# Patient Record
Sex: Female | Born: 1975 | Race: White | Hispanic: No | Marital: Married | Smoking: Never smoker
Health system: Southern US, Community
[De-identification: ages and names within clinical notes are randomized; demographics above are authoritative.]

---

## 1999-06-16 HISTORY — PX: BREAST LUMPECTOMY: SHX2

## 2021-01-29 ENCOUNTER — Ambulatory Visit (INDEPENDENT_AMBULATORY_CARE_PROVIDER_SITE_OTHER): Payer: 59 | Admitting: Family Medicine

## 2021-01-29 ENCOUNTER — Other Ambulatory Visit: Payer: Self-pay

## 2021-01-29 ENCOUNTER — Encounter: Payer: Self-pay | Admitting: Family Medicine

## 2021-01-29 VITALS — BP 148/91 | HR 74 | Temp 97.5°F | Resp 16 | Ht 69.0 in | Wt 175.0 lb

## 2021-01-29 DIAGNOSIS — Z7689 Persons encountering health services in other specified circumstances: Secondary | ICD-10-CM | POA: Diagnosis not present

## 2021-01-29 DIAGNOSIS — M2550 Pain in unspecified joint: Secondary | ICD-10-CM | POA: Diagnosis not present

## 2021-01-29 NOTE — Progress Notes (Signed)
   New Patient Office Visit  Subjective:  Patient ID: Regina Haley, female    DOB: 04/17/1976  Age: 45 y.o. MRN: [id number]  CC:  Chief Complaint  Patient presents with   joint pain    HPI Regina Haley presents for to establish care. Patient complains today of joint pain. Sx have been for about a year but now worsening. Was intermittent but now constant. Takes nsaids for sx with some relief. Sx are worse at night.   No past medical history on file.  Family History  Problem Relation Age of Onset   Arthritis Mother    Diabetes Sister    Cancer Maternal Grandmother     Social History   Socioeconomic History   Marital status: Married    Spouse name: Not on file   Number of children: 3   Years of education: Not on file   Highest education level: Not on file  Occupational History   Not on file  Tobacco Use   Smoking status: Never   Smokeless tobacco: Never  Vaping Use   Vaping Use: Never used  Substance and Sexual Activity   Alcohol use: Not Currently   Drug use: Not Currently   Sexual activity: Yes  Other Topics Concern   Not on file  Social History Narrative   Not on file   Social Determinants of Health   Financial Resource Strain: Not on file  Food Insecurity: Not on file  Transportation Needs: Not on file  Physical Activity: Not on file  Stress: Not on file  Social Connections: Not on file  Intimate Partner Violence: Not on file    ROS Review of Systems  Musculoskeletal:  Positive for arthralgias.  All other systems reviewed and are negative.  Objective:   Today's Vitals: BP (!) 148/91 (BP Location: Right Arm, Patient Position: Sitting, Cuff Size: Normal)   Pulse 74   Temp (!) 97.5 F (36.4 C)   Resp 16   Ht 5\' 9"  (1.753 m)   Wt 175 lb (79.4 kg)   LMP  (LMP Unknown)   SpO2 96%   BMI 25.84 kg/m   Physical Exam Vitals and nursing note reviewed.  Constitutional:      General: She is not in acute distress. Cardiovascular:     Rate and  Rhythm: Normal rate and regular rhythm.  Pulmonary:     Effort: Pulmonary effort is normal.     Breath sounds: Normal breath sounds.  Abdominal:     Palpations: Abdomen is soft.     Tenderness: There is no abdominal tenderness.  Musculoskeletal:        General: Tenderness (joints) present. No swelling or deformity.  Neurological:     General: No focal deficit present.     Mental Status: She is alert and oriented to person, place, and time.    Assessment & Plan:  1. Arthralgia, unspecified joint Referral for labs.  Patient to utilize OTC Tylenol/NSAIDs as needed for symptoms.  Consider referral to consultant pending results of lab work. - Sedimentation Rate - ANA - Rheumatoid factor - CBC with Differential - Basic Metabolic Panel  2. Encounter to establish care   Outpatient Encounter Medications as of 01/29/2021  Medication Sig   sertraline (ZOLOFT) 50 MG tablet Take 50 mg by mouth daily.   No facility-administered encounter medications on file as of 01/29/2021.    Follow-up: Return in about 6 weeks (around 03/12/2021) for follow up.   03/14/2021, MD

## 2021-01-29 NOTE — Progress Notes (Signed)
Establish care-  Joint pain 4/10 constant, worse at night  Take ibuprofen  ___________________   Irregular menses

## 2021-01-30 ENCOUNTER — Encounter: Payer: Self-pay | Admitting: Family Medicine

## 2021-01-30 LAB — BASIC METABOLIC PANEL
BUN/Creatinine Ratio: 11 (ref 9–23)
BUN: 9 mg/dL (ref 6–24)
CO2: 23 mmol/L (ref 20–29)
Calcium: 9.2 mg/dL (ref 8.7–10.2)
Chloride: 105 mmol/L (ref 96–106)
Creatinine, Ser: 0.8 mg/dL (ref 0.57–1.00)
Glucose: 76 mg/dL (ref 65–99)
Potassium: 4.2 mmol/L (ref 3.5–5.2)
Sodium: 143 mmol/L (ref 134–144)
eGFR: 93 mL/min/{1.73_m2} (ref >59)

## 2021-01-30 LAB — CBC WITH DIFFERENTIAL/PLATELET
Basophils Absolute: 0 10*3/uL (ref 0.0–0.2)
Basos: 0 %
EOS (ABSOLUTE): 0.1 10*3/uL (ref 0.0–0.4)
Eos: 1 %
Hematocrit: 38.8 % (ref 34.0–46.6)
Hemoglobin: 13.2 g/dL (ref 11.1–15.9)
Immature Grans (Abs): 0 10*3/uL (ref 0.0–0.1)
Immature Granulocytes: 0 %
Lymphocytes Absolute: 2 10*3/uL (ref 0.7–3.1)
Lymphs: 27 %
MCH: 31.3 pg (ref 26.6–33.0)
MCHC: 34 g/dL (ref 31.5–35.7)
MCV: 92 fL (ref 79–97)
Monocytes Absolute: 0.5 10*3/uL (ref 0.1–0.9)
Monocytes: 7 %
Neutrophils Absolute: 4.8 10*3/uL (ref 1.4–7.0)
Neutrophils: 65 %
Platelets: 326 10*3/uL (ref 150–450)
RBC: 4.22 x10E6/uL (ref 3.77–5.28)
RDW: 12.8 % (ref 11.7–15.4)
WBC: 7.4 10*3/uL (ref 3.4–10.8)

## 2021-01-30 LAB — RHEUMATOID FACTOR: Rheumatoid fact SerPl-aCnc: <10 IU/mL (ref <14.0)

## 2021-01-30 LAB — ANA: Anti Nuclear Antibody (ANA): NEGATIVE

## 2021-01-30 LAB — SEDIMENTATION RATE: Sed Rate: 2 mm/hr (ref 0–32)

## 2021-07-18 ENCOUNTER — Other Ambulatory Visit: Payer: Self-pay | Admitting: Family Medicine

## 2021-07-24 ENCOUNTER — Ambulatory Visit: Payer: 59 | Admitting: Family Medicine

## 2021-07-24 ENCOUNTER — Other Ambulatory Visit: Payer: Self-pay

## 2021-07-24 ENCOUNTER — Encounter: Payer: Self-pay | Admitting: Family Medicine

## 2021-07-24 VITALS — BP 126/86 | HR 66 | Temp 98.1°F | Resp 16 | Wt 182.4 lb

## 2021-07-24 DIAGNOSIS — N951 Menopausal and female climacteric states: Secondary | ICD-10-CM

## 2021-07-24 DIAGNOSIS — M549 Dorsalgia, unspecified: Secondary | ICD-10-CM

## 2021-07-24 DIAGNOSIS — N3000 Acute cystitis without hematuria: Secondary | ICD-10-CM

## 2021-07-24 DIAGNOSIS — F32A Depression, unspecified: Secondary | ICD-10-CM

## 2021-07-24 LAB — POCT URINALYSIS DIP (CLINITEK)
Bilirubin, UA: NEGATIVE
Blood, UA: NEGATIVE
Glucose, UA: NEGATIVE mg/dL
Ketones, POC UA: NEGATIVE mg/dL
Leukocytes, UA: NEGATIVE
Nitrite, UA: NEGATIVE
POC PROTEIN,UA: NEGATIVE
Spec Grav, UA: 1.01 (ref 1.010–1.025)
Urobilinogen, UA: 0.2 E.U./dL
pH, UA: 7.5 (ref 5.0–8.0)

## 2021-07-24 MED ORDER — SERTRALINE HCL 100 MG PO TABS: 100.0000 mg | ORAL_TABLET | Freq: Every day | ORAL | 1 refills | Status: DC

## 2021-07-24 MED ORDER — NITROFURANTOIN MONOHYD MACRO 100 MG PO CAPS: 100.0000 mg | ORAL_CAPSULE | Freq: Two times a day (BID) | ORAL | 0 refills | Status: AC

## 2021-07-24 NOTE — Progress Notes (Signed)
O oPatient is here for medication refill of her sertraline.   Patient also c/o back pain with a possible UTI . This has been present for 1 week.

## 2021-07-25 NOTE — Progress Notes (Signed)
Established  Patient Office Visit  Subjective:  Patient ID: Regina Haley, female    DOB: Jan 30, 1976  Age: 46 y.o. MRN: [id number]  CC:  Chief Complaint  Patient presents with   Urinary Tract Infection    HPI Regina Haley presents for complaint of back pain with strong smelling urine. Patient denies fever./chills or dysuria. Patient also request refills of zoloft and would like to try increasing dose. She also reports hot flashes.   No past medical history on file.  Past Surgical History:  Procedure Laterality Date   BREAST LUMPECTOMY Right 2001    Family History  Problem Relation Age of Onset   Arthritis Mother    Diabetes Sister    Cancer Maternal Grandmother     Social History   Socioeconomic History   Marital status: Married    Spouse name: Not on file   Number of children: 3   Years of education: Not on file   Highest education level: Not on file  Occupational History   Not on file  Tobacco Use   Smoking status: Never   Smokeless tobacco: Never  Vaping Use   Vaping Use: Never used  Substance and Sexual Activity   Alcohol use: Not Currently   Drug use: Not Currently   Sexual activity: Yes  Other Topics Concern   Not on file  Social History Narrative   Not on file   Social Determinants of Health   Financial Resource Strain: Not on file  Food Insecurity: Not on file  Transportation Needs: Not on file  Physical Activity: Not on file  Stress: Not on file  Social Connections: Not on file  Intimate Partner Violence: Not on file    ROS Review of Systems  Genitourinary:  Negative for dysuria.  Musculoskeletal:  Positive for back pain.  Psychiatric/Behavioral:  Positive for sleep disturbance. Negative for self-injury and suicidal ideas. The patient is not nervous/anxious.   All other systems reviewed and are negative.  Objective:   Today's Vitals: BP 126/86    Pulse 66    Temp 98.1 F (36.7 C) (Oral)    Resp 16    Wt 182 lb 6.4 oz (82.7 kg)     SpO2 98%    BMI 26.94 kg/m   Physical Exam Vitals and nursing note reviewed.  Constitutional:      General: She is not in acute distress. Cardiovascular:     Rate and Rhythm: Normal rate and regular rhythm.  Pulmonary:     Effort: Pulmonary effort is normal.     Breath sounds: Normal breath sounds.  Abdominal:     Palpations: Abdomen is soft.     Tenderness: There is no abdominal tenderness.  Neurological:     General: No focal deficit present.     Mental Status: She is alert and oriented to person, place, and time.  Psychiatric:        Mood and Affect: Mood normal.        Behavior: Behavior normal.    Assessment & Plan:   1. Depression, unspecified depression type Zoloft increased form 50 mg daily to 100mg  daily. monitor  2. Acute cystitis without hematuria Macrobid prescribed - POCT URINALYSIS DIP (CLINITEK)  3. Perimenopausal Patient will try OTC agent for sx.     Outpatient Encounter Medications as of 07/24/2021  Medication Sig   nitrofurantoin, macrocrystal-monohydrate, (MACROBID) 100 MG capsule Take 1 capsule (100 mg total) by mouth 2 (two) times daily.   sertraline (ZOLOFT) 100 MG tablet  Take 1 tablet (100 mg total) by mouth at bedtime.   [DISCONTINUED] sertraline (ZOLOFT) 100 MG tablet    [DISCONTINUED] sertraline (ZOLOFT) 50 MG tablet Take 50 mg by mouth daily.   No facility-administered encounter medications on file as of 07/24/2021.    Follow-up: No follow-ups on file.   Becky Sax, MD

## 2021-09-16 ENCOUNTER — Ambulatory Visit (INDEPENDENT_AMBULATORY_CARE_PROVIDER_SITE_OTHER): Payer: 59 | Admitting: Family Medicine

## 2021-09-16 VITALS — BP 116/81 | HR 63 | Temp 98.0°F | Resp 16 | Wt 183.6 lb

## 2021-09-16 DIAGNOSIS — F32A Depression, unspecified: Secondary | ICD-10-CM

## 2021-09-16 DIAGNOSIS — Z1231 Encounter for screening mammogram for malignant neoplasm of breast: Secondary | ICD-10-CM

## 2021-09-16 MED ORDER — SERTRALINE HCL 100 MG PO TABS: 100.0000 mg | ORAL_TABLET | Freq: Every day | ORAL | 1 refills | Status: DC

## 2021-09-16 NOTE — Progress Notes (Signed)
? ?Established Patient Office Visit ? ?Subjective:  ?Patient ID: Regina Haley, female    DOB: 09/10/1975  Age: 46 y.o. MRN: [id number] ? ?CC:  ?Chief Complaint  ?Patient presents with  ? Follow-up  ? Depression  ? ? ?HPI ?Regina Haley presents for follow up of depression.  ? ?No past medical history on file. ? ?Past Surgical History:  ?Procedure Laterality Date  ? BREAST LUMPECTOMY Right 2001  ? ? ?Family History  ?Problem Relation Age of Onset  ? Arthritis Mother   ? Diabetes Sister   ? Cancer Maternal Grandmother   ? ? ?Social History  ? ?Socioeconomic History  ? Marital status: Married  ?  Spouse name: Not on file  ? Number of children: 3  ? Years of education: Not on file  ? Highest education level: Not on file  ?Occupational History  ? Not on file  ?Tobacco Use  ? Smoking status: Never  ? Smokeless tobacco: Never  ?Vaping Use  ? Vaping Use: Never used  ?Substance and Sexual Activity  ? Alcohol use: Not Currently  ? Drug use: Not Currently  ? Sexual activity: Yes  ?Other Topics Concern  ? Not on file  ?Social History Narrative  ? Not on file  ? ?Social Determinants of Health  ? ?Financial Resource Strain: Not on file  ?Food Insecurity: Not on file  ?Transportation Needs: Not on file  ?Physical Activity: Not on file  ?Stress: Not on file  ?Social Connections: Not on file  ?Intimate Partner Violence: Not on file  ? ? ?ROS ?Review of Systems  ?Genitourinary:  Negative for dysuria.  ?Psychiatric/Behavioral:  Positive for sleep disturbance. Negative for self-injury and suicidal ideas. The patient is not nervous/anxious.   ?All other systems reviewed and are negative. ? ?Objective:  ? ?Today's Vitals: BP 116/81   Pulse 63   Temp 98 ?F (36.7 ?C) (Oral)   Resp 16   Wt 183 lb 9.6 oz (83.3 kg)   SpO2 97%   BMI 27.11 kg/m?  ? ?Physical Exam ?Vitals and nursing note reviewed.  ?Constitutional:   ?   General: She is not in acute distress. ?Cardiovascular:  ?   Rate and Rhythm: Normal rate and regular rhythm.   ?Pulmonary:  ?   Effort: Pulmonary effort is normal.  ?   Breath sounds: Normal breath sounds.  ?Abdominal:  ?   Palpations: Abdomen is soft.  ?   Tenderness: There is no abdominal tenderness.  ?Neurological:  ?   General: No focal deficit present.  ?   Mental Status: She is alert and oriented to person, place, and time.  ?Psychiatric:     ?   Mood and Affect: Mood normal.     ?   Behavior: Behavior normal.  ? ? ?Assessment & Plan:  ? ?1. Depression, unspecified depression type ?Appears stable and doing well with present management. Continue and monitor. Meds refilled ? ?2. Encounter for screening mammogram for malignant neoplasm of breast ?Referral for mammogram ?- MM Digital Screening; Future ? ? ? ?Outpatient Encounter Medications as of 09/16/2021  ?Medication Sig  ? nitrofurantoin, macrocrystal-monohydrate, (MACROBID) 100 MG capsule Take 1 capsule (100 mg total) by mouth 2 (two) times daily.  ? [DISCONTINUED] sertraline (ZOLOFT) 100 MG tablet Take 1 tablet (100 mg total) by mouth at bedtime.  ? sertraline (ZOLOFT) 100 MG tablet Take 1 tablet (100 mg total) by mouth at bedtime.  ? ?No facility-administered encounter medications on file as of 09/16/2021.  ? ? ?  Follow-up: Return in about 6 months (around 03/18/2022) for physical.  ? ?Tommie Raymond, MD ? ?

## 2021-09-16 NOTE — Progress Notes (Signed)
Patient is her to follow-up depression. Patient said that she is doing very well.me ? ?

## 2021-09-17 ENCOUNTER — Encounter: Payer: Self-pay | Admitting: Family Medicine

## 2021-10-01 ENCOUNTER — Ambulatory Visit: Payer: 59

## 2021-11-20 ENCOUNTER — Ambulatory Visit (INDEPENDENT_AMBULATORY_CARE_PROVIDER_SITE_OTHER): Payer: 59

## 2021-11-20 DIAGNOSIS — Z1231 Encounter for screening mammogram for malignant neoplasm of breast: Secondary | ICD-10-CM | POA: Diagnosis not present

## 2022-03-19 ENCOUNTER — Other Ambulatory Visit: Payer: Self-pay | Admitting: Family Medicine

## 2022-03-19 ENCOUNTER — Encounter: Payer: 59 | Admitting: Family Medicine

## 2022-03-19 NOTE — Telephone Encounter (Signed)
Attempted to call patient- left message on VM to call office for appointment. Courtesy RF 30 day sent to pharmacy Requested Prescriptions  Pending Prescriptions Disp Refills  . sertraline (ZOLOFT) 100 MG tablet [Pharmacy Med Name: SERTRALINE 100MG  TABLETS] 30 tablet 0    Sig: TAKE 1 TABLET(100 MG) BY MOUTH AT BEDTIME     Psychiatry:  Antidepressants - SSRI - sertraline Failed - 03/19/2022  3:20 AM      Failed - AST in normal range and within 360 days    No results found for: "POCAST", "AST"       Failed - ALT in normal range and within 360 days    No results found for: "ALT", "LABALT", "POCALT"       Failed - Valid encounter within last 6 months    Recent Outpatient Visits          6 months ago Depression, unspecified depression type   [facility] at [facility], MD   7 months ago Depression, unspecified depression type   Primary Care at [facility], MD   1 year ago Arthralgia, unspecified joint   Primary Care at [facility], Clyde Canterbury, MD             Passed - Completed PHQ-2 or PHQ-9 in the last 360 days

## 2022-04-26 ENCOUNTER — Other Ambulatory Visit: Payer: Self-pay | Admitting: Family Medicine

## 2022-04-27 NOTE — Telephone Encounter (Signed)
Requested medications are due for refill today.  yes  Requested medications are on the active medications list.  yes  Last refill. 03/19/2022 #30 0 rf  Future visit scheduled.   no  Notes to clinic.  Missing labs. Pt already given a courtesy refill.    Requested Prescriptions  Pending Prescriptions Disp Refills   sertraline (ZOLOFT) 100 MG tablet [Pharmacy Med Name: SERTRALINE 100MG  TABLETS] 30 tablet 0    Sig: TAKE 1 TABLET(100 MG) BY MOUTH AT BEDTIME     Psychiatry:  Antidepressants - SSRI - sertraline Failed - 04/26/2022  3:20 AM      Failed - AST in normal range and within 360 days    No results found for: "POCAST", "AST"       Failed - ALT in normal range and within 360 days    No results found for: "ALT", "LABALT", "POCALT"       Failed - Valid encounter within last 6 months    Recent Outpatient Visits           7 months ago Depression, unspecified depression type   Primary Care at [facility], MD   9 months ago Depression, unspecified depression type   Primary Care at [facility], MD   1 year ago Arthralgia, unspecified joint   Primary Care at [facility], MD              Passed - Completed PHQ-2 or PHQ-9 in the last 360 days

## 2022-08-25 DIAGNOSIS — Z6828 Body mass index (BMI) 28.0-28.9, adult: Secondary | ICD-10-CM | POA: Diagnosis not present

## 2022-08-25 DIAGNOSIS — Z01419 Encounter for gynecological examination (general) (routine) without abnormal findings: Secondary | ICD-10-CM | POA: Diagnosis not present

## 2022-08-25 DIAGNOSIS — N959 Unspecified menopausal and perimenopausal disorder: Secondary | ICD-10-CM | POA: Diagnosis not present

## 2022-09-16 LAB — COLOGUARD

## 2022-09-16 LAB — EXTERNAL GENERIC LAB PROCEDURE

## 2022-10-06 DIAGNOSIS — Z1211 Encounter for screening for malignant neoplasm of colon: Secondary | ICD-10-CM | POA: Diagnosis not present

## 2022-10-15 LAB — COLOGUARD: COLOGUARD: NEGATIVE

## 2022-10-15 LAB — EXTERNAL GENERIC LAB PROCEDURE: COLOGUARD: NEGATIVE

## 2023-03-18 DIAGNOSIS — R350 Frequency of micturition: Secondary | ICD-10-CM | POA: Diagnosis not present

## 2023-04-01 DIAGNOSIS — M2550 Pain in unspecified joint: Secondary | ICD-10-CM | POA: Diagnosis not present

## 2023-04-28 DIAGNOSIS — R7989 Other specified abnormal findings of blood chemistry: Secondary | ICD-10-CM | POA: Diagnosis not present

## 2023-04-28 DIAGNOSIS — R300 Dysuria: Secondary | ICD-10-CM | POA: Diagnosis not present

## 2023-07-20 DIAGNOSIS — Z6828 Body mass index (BMI) 28.0-28.9, adult: Secondary | ICD-10-CM | POA: Diagnosis not present

## 2023-07-20 DIAGNOSIS — M25571 Pain in right ankle and joints of right foot: Secondary | ICD-10-CM | POA: Diagnosis not present

## 2023-08-26 DIAGNOSIS — N959 Unspecified menopausal and perimenopausal disorder: Secondary | ICD-10-CM | POA: Diagnosis not present

## 2023-08-26 DIAGNOSIS — Z01419 Encounter for gynecological examination (general) (routine) without abnormal findings: Secondary | ICD-10-CM | POA: Diagnosis not present

## 2023-08-26 DIAGNOSIS — Z6828 Body mass index (BMI) 28.0-28.9, adult: Secondary | ICD-10-CM | POA: Diagnosis not present

## 2023-08-26 DIAGNOSIS — R5383 Other fatigue: Secondary | ICD-10-CM | POA: Diagnosis not present

## 2023-09-02 DIAGNOSIS — Z1231 Encounter for screening mammogram for malignant neoplasm of breast: Secondary | ICD-10-CM | POA: Diagnosis not present

## 2024-01-28 ENCOUNTER — Other Ambulatory Visit: Payer: Self-pay | Admitting: Medical Genetics

## 2024-03-13 ENCOUNTER — Other Ambulatory Visit (HOSPITAL_COMMUNITY)
Admission: RE | Admit: 2024-03-13 | Discharge: 2024-03-13 | Disposition: A | Discharge status: Home / Self Care | Payer: Self-pay | Source: Ambulatory Visit | Attending: Medical Genetics | Admitting: Medical Genetics

## 2024-03-13 IMAGING — MG MM DIGITAL SCREENING BILAT W/ TOMO AND CAD
6 of 12 series · 6 of 36 positions shown · non-contrast
Comparison: None available.

CLINICAL DATA: Screening.

EXAM:
DIGITAL SCREENING BILATERAL MAMMOGRAM WITH TOMOSYNTHESIS AND CAD
TECHNIQUE: Bilateral screening digital craniocaudal and mediolateral oblique
mammograms were obtained. Bilateral screening digital breast
tomosynthesis was performed. The images were evaluated with
computer-aided detection.

[R CC synth-2D]
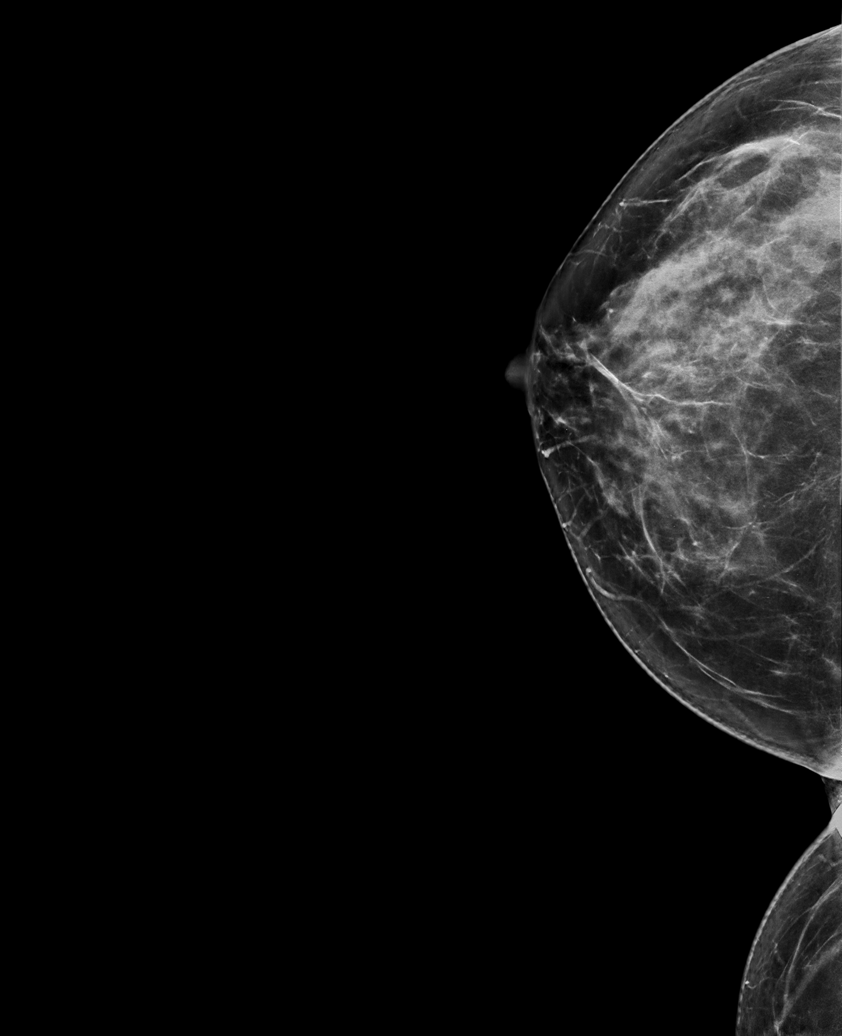

[L XCCL synth-2D]
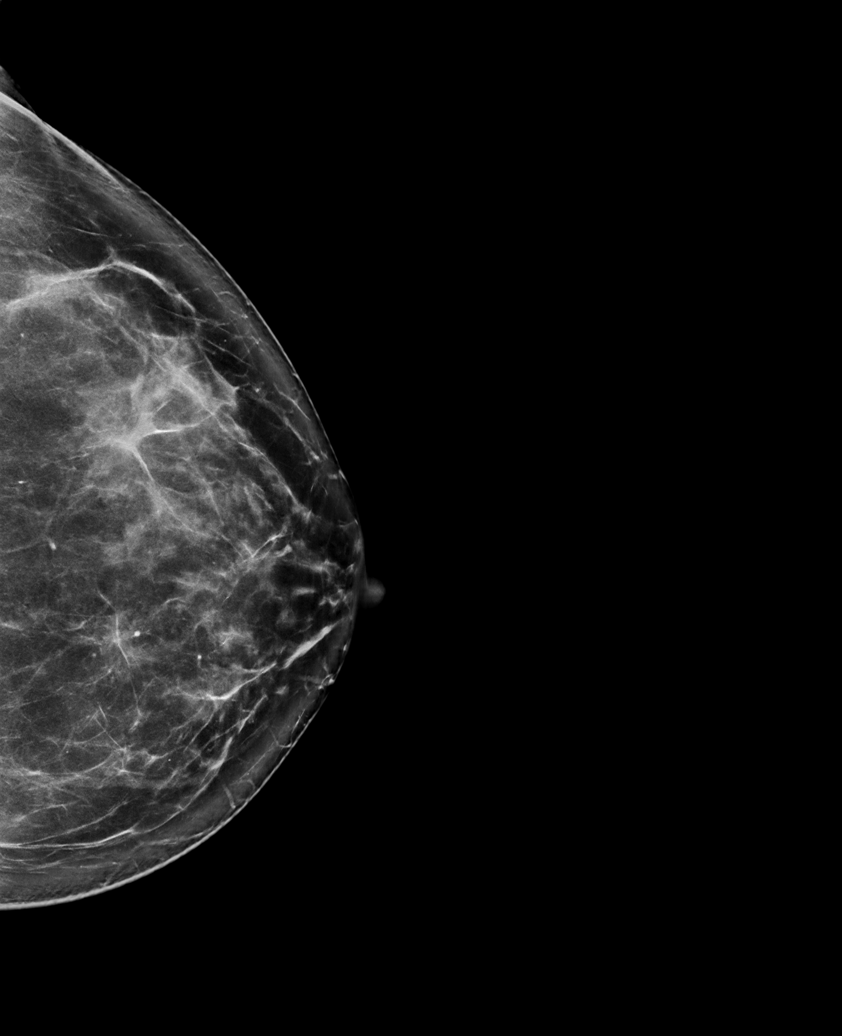

[R XCCL synth-2D]
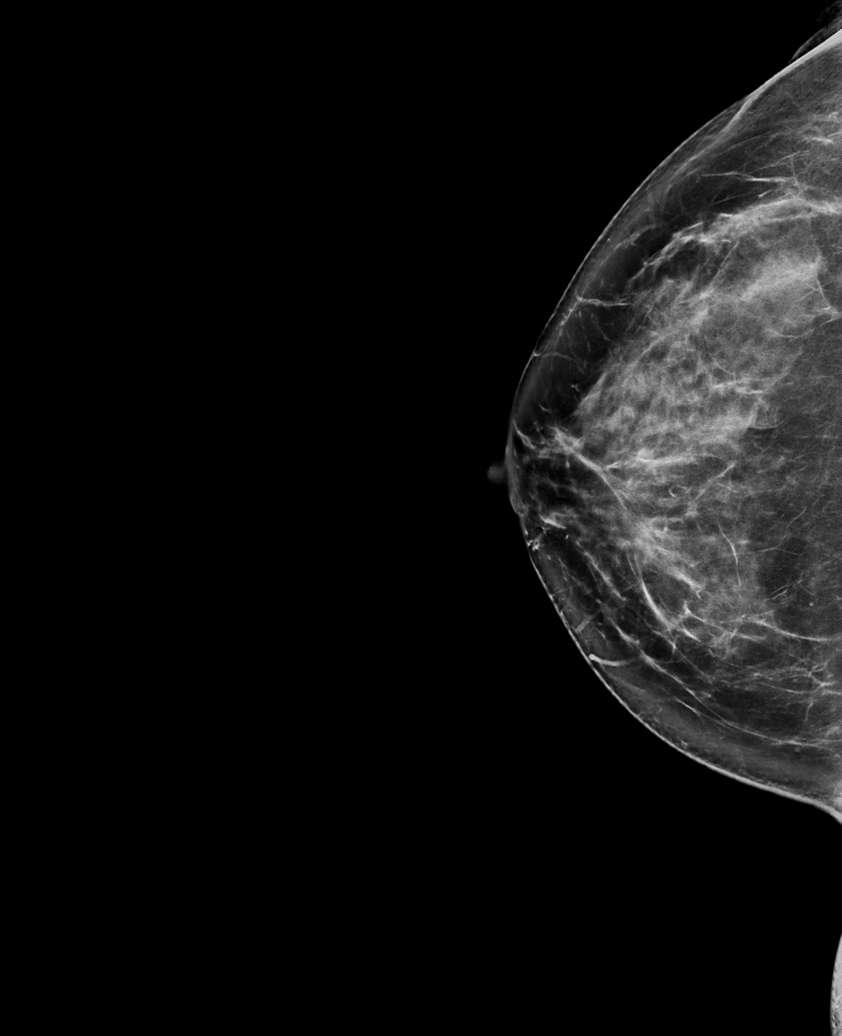

[L MLO synth-2D]
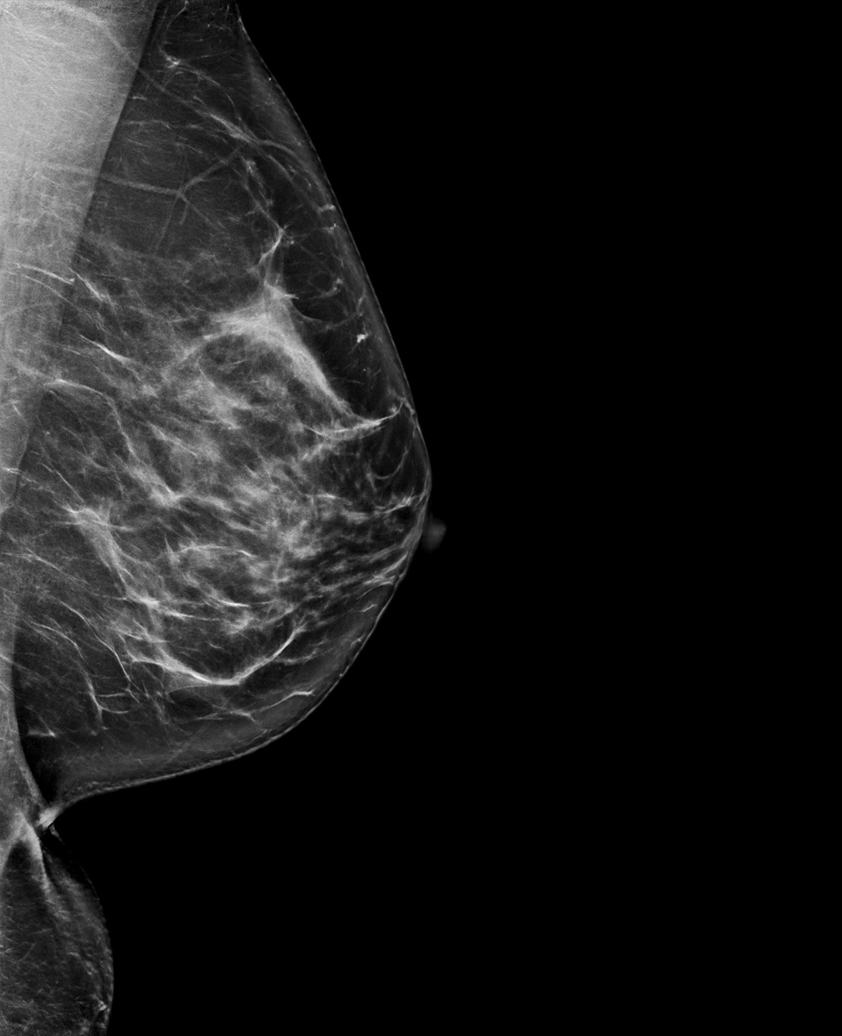

[L CC synth-2D]
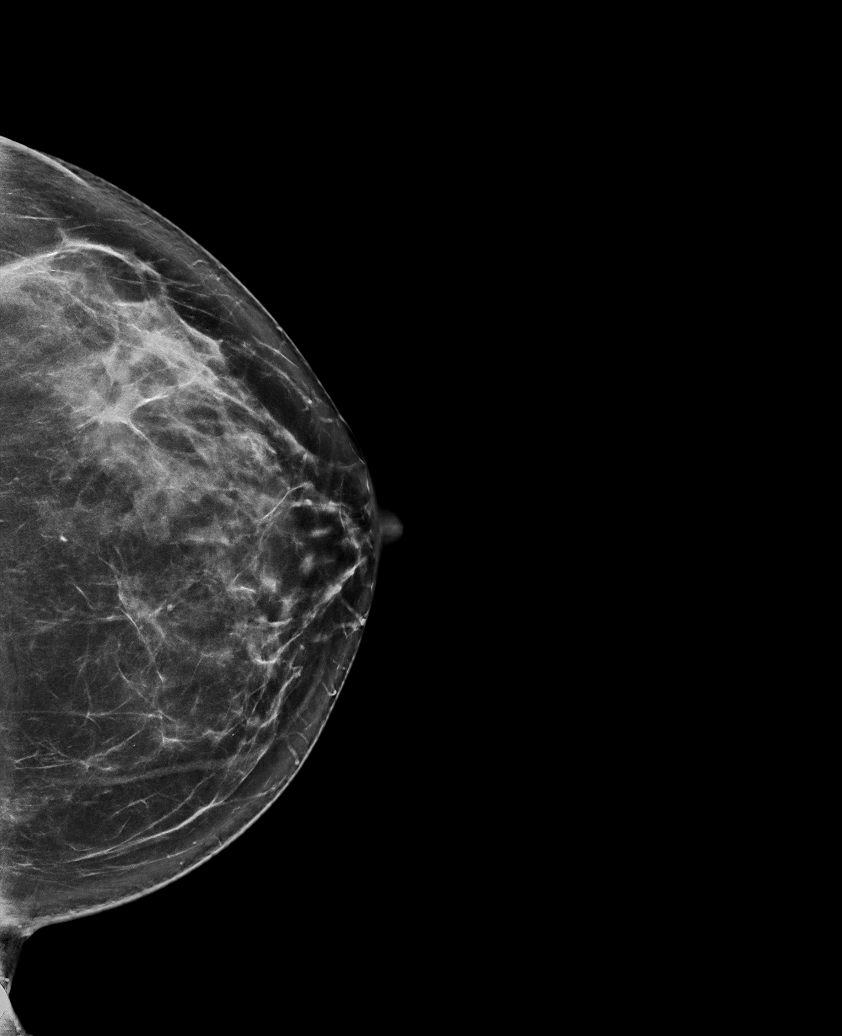

[R MLO synth-2D]
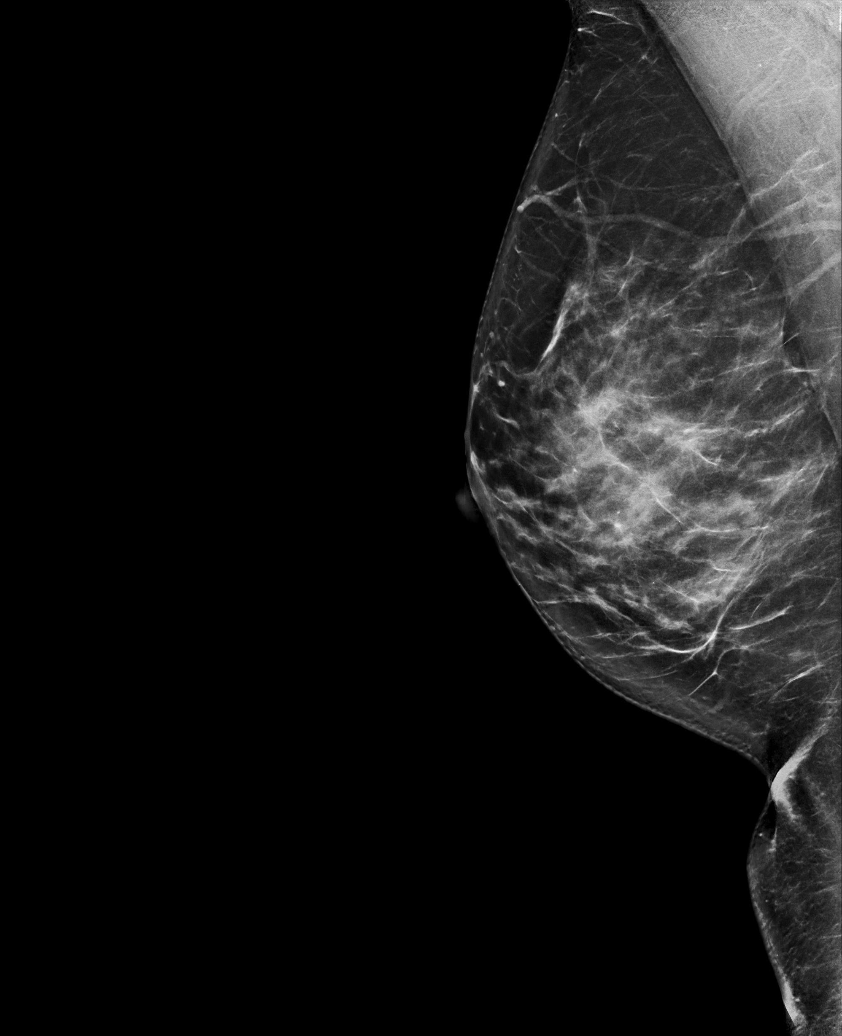

[6 of 36 positions shown; findings below may reference images not displayed]

ACR Breast Density Category c: The breast tissue is heterogeneously
dense, which may obscure small masses
FINDINGS: There are no findings suspicious for malignancy.
IMPRESSION: No mammographic evidence of malignancy. A result letter of this
screening mammogram will be mailed directly to the patient.

RECOMMENDATION:
Screening mammogram in one year. (Code:A7-O-PCM)

BI-RADS CATEGORY  1: Negative.

## 2024-03-21 LAB — GENECONNECT MOLECULAR SCREEN: Genetic Analysis Overall Interpretation: NEGATIVE

## 2024-04-07 DIAGNOSIS — F39 Unspecified mood [affective] disorder: Secondary | ICD-10-CM | POA: Diagnosis not present

## 2024-04-07 DIAGNOSIS — Z79890 Hormone replacement therapy (postmenopausal): Secondary | ICD-10-CM | POA: Diagnosis not present
